# Patient Record
Sex: Female | Born: 1940 | Race: Black or African American | Hispanic: No | Marital: Married | Smoking: Current some day smoker
Health system: Southern US, Community
[De-identification: ages and names within clinical notes are randomized; demographics above are authoritative.]

## PROBLEM LIST (undated history)

## (undated) DIAGNOSIS — I10 Essential (primary) hypertension: Secondary | ICD-10-CM

## (undated) DIAGNOSIS — F419 Anxiety disorder, unspecified: Secondary | ICD-10-CM

## (undated) DIAGNOSIS — M1990 Unspecified osteoarthritis, unspecified site: Secondary | ICD-10-CM

## (undated) HISTORY — PX: ABDOMINAL HYSTERECTOMY: SHX81

## (undated) HISTORY — PX: BUNIONECTOMY: SHX129

---

## 1997-10-18 ENCOUNTER — Emergency Department (HOSPITAL_COMMUNITY): Admission: EM | Admit: 1997-10-18 | Discharge: 1997-10-18 | Payer: Self-pay | Admitting: Emergency Medicine

## 1998-03-09 ENCOUNTER — Emergency Department (HOSPITAL_COMMUNITY): Admission: EM | Admit: 1998-03-09 | Discharge: 1998-03-09 | Payer: Self-pay | Admitting: Emergency Medicine

## 1998-08-18 ENCOUNTER — Emergency Department (HOSPITAL_COMMUNITY): Admission: EM | Admit: 1998-08-18 | Discharge: 1998-08-18 | Payer: Self-pay | Admitting: *Deleted

## 1998-09-16 ENCOUNTER — Emergency Department (HOSPITAL_COMMUNITY): Admission: EM | Admit: 1998-09-16 | Discharge: 1998-09-16 | Payer: Self-pay | Admitting: *Deleted

## 1998-09-27 ENCOUNTER — Emergency Department (HOSPITAL_COMMUNITY): Admission: EM | Admit: 1998-09-27 | Discharge: 1998-09-27 | Payer: Self-pay | Admitting: *Deleted

## 1999-03-21 ENCOUNTER — Emergency Department (HOSPITAL_COMMUNITY): Admission: EM | Admit: 1999-03-21 | Discharge: 1999-03-21 | Payer: Self-pay

## 1999-08-06 ENCOUNTER — Emergency Department (HOSPITAL_COMMUNITY): Admission: EM | Admit: 1999-08-06 | Discharge: 1999-08-06 | Payer: Self-pay | Admitting: Emergency Medicine

## 1999-12-02 ENCOUNTER — Emergency Department (HOSPITAL_COMMUNITY): Admission: EM | Admit: 1999-12-02 | Discharge: 1999-12-02 | Payer: Self-pay | Admitting: Internal Medicine

## 2000-02-13 ENCOUNTER — Emergency Department (HOSPITAL_COMMUNITY): Admission: EM | Admit: 2000-02-13 | Discharge: 2000-02-13 | Payer: Self-pay | Admitting: Emergency Medicine

## 2003-11-26 ENCOUNTER — Emergency Department (HOSPITAL_COMMUNITY): Admission: EM | Admit: 2003-11-26 | Discharge: 2003-11-26 | Payer: Self-pay | Admitting: Emergency Medicine

## 2005-04-08 ENCOUNTER — Ambulatory Visit (HOSPITAL_COMMUNITY): Admission: RE | Admit: 2005-04-08 | Discharge: 2005-04-08 | Payer: Self-pay

## 2005-05-27 ENCOUNTER — Other Ambulatory Visit: Admission: RE | Admit: 2005-05-27 | Discharge: 2005-05-27 | Payer: Self-pay | Admitting: Family Medicine

## 2006-07-20 ENCOUNTER — Encounter: Admission: RE | Admit: 2006-07-20 | Discharge: 2006-07-20 | Payer: Self-pay | Admitting: Family Medicine

## 2007-07-17 ENCOUNTER — Emergency Department (HOSPITAL_COMMUNITY): Admission: EM | Admit: 2007-07-17 | Discharge: 2007-07-17 | Payer: Self-pay | Admitting: Emergency Medicine

## 2008-08-31 ENCOUNTER — Encounter: Admission: RE | Admit: 2008-08-31 | Discharge: 2008-08-31 | Payer: Self-pay | Admitting: Family Medicine

## 2008-10-02 ENCOUNTER — Emergency Department (HOSPITAL_COMMUNITY): Admission: EM | Admit: 2008-10-02 | Discharge: 2008-10-02 | Payer: Self-pay | Admitting: Emergency Medicine

## 2008-10-16 ENCOUNTER — Encounter: Admission: RE | Admit: 2008-10-16 | Discharge: 2008-10-16 | Payer: Self-pay | Admitting: Family Medicine

## 2009-11-11 ENCOUNTER — Emergency Department (HOSPITAL_BASED_OUTPATIENT_CLINIC_OR_DEPARTMENT_OTHER): Admission: EM | Admit: 2009-11-11 | Discharge: 2009-11-11 | Payer: Self-pay | Admitting: Emergency Medicine

## 2009-11-11 ENCOUNTER — Ambulatory Visit: Payer: Self-pay | Admitting: Diagnostic Radiology

## 2010-02-17 ENCOUNTER — Encounter: Payer: Self-pay | Admitting: Family Medicine

## 2010-04-10 LAB — URINALYSIS, ROUTINE W REFLEX MICROSCOPIC
Bilirubin Urine: NEGATIVE
Glucose, UA: NEGATIVE mg/dL
Ketones, ur: NEGATIVE mg/dL
pH: 6.5 (ref 5.0–8.0)

## 2010-04-10 LAB — DIFFERENTIAL
Basophils Relative: 1 % (ref 0–1)
Eosinophils Absolute: 0 10*3/uL (ref 0.0–0.7)
Lymphs Abs: 1.1 10*3/uL (ref 0.7–4.0)

## 2010-04-10 LAB — CBC
HCT: 39.6 % (ref 36.0–46.0)
MCHC: 34.2 g/dL (ref 30.0–36.0)
MCV: 88.5 fL (ref 78.0–100.0)
RDW: 13 % (ref 11.5–15.5)
WBC: 6.3 10*3/uL (ref 4.0–10.5)

## 2010-04-10 LAB — URINE MICROSCOPIC-ADD ON

## 2010-04-10 LAB — BASIC METABOLIC PANEL
BUN: 15 mg/dL (ref 6–23)
Chloride: 102 mEq/L (ref 96–112)
Glucose, Bld: 83 mg/dL (ref 70–99)
Potassium: 3.6 mEq/L (ref 3.5–5.1)

## 2010-04-10 LAB — URINE CULTURE

## 2010-05-03 LAB — POCT I-STAT, CHEM 8
Chloride: 89 mEq/L — ABNORMAL LOW (ref 96–112)
HCT: 41 % (ref 36.0–46.0)
Hemoglobin: 13.9 g/dL (ref 12.0–15.0)
Potassium: 3.1 mEq/L — ABNORMAL LOW (ref 3.5–5.1)
Sodium: 130 mEq/L — ABNORMAL LOW (ref 135–145)

## 2010-05-30 ENCOUNTER — Inpatient Hospital Stay (INDEPENDENT_AMBULATORY_CARE_PROVIDER_SITE_OTHER)
Admission: RE | Admit: 2010-05-30 | Discharge: 2010-05-30 | Disposition: A | Discharge status: Home / Self Care | Payer: Medicare Other | Source: Ambulatory Visit | Attending: Emergency Medicine | Admitting: Emergency Medicine

## 2010-05-30 ENCOUNTER — Observation Stay (HOSPITAL_COMMUNITY)
Admission: EM | Admit: 2010-05-30 | Discharge: 2010-05-30 | Disposition: A | Discharge status: Home / Self Care | Payer: Medicare Other | Source: Ambulatory Visit | Attending: Emergency Medicine | Admitting: Emergency Medicine

## 2010-05-30 DIAGNOSIS — N289 Disorder of kidney and ureter, unspecified: Secondary | ICD-10-CM | POA: Insufficient documentation

## 2010-05-30 DIAGNOSIS — I10 Essential (primary) hypertension: Secondary | ICD-10-CM

## 2010-05-30 DIAGNOSIS — R630 Anorexia: Secondary | ICD-10-CM

## 2010-05-30 DIAGNOSIS — N19 Unspecified kidney failure: Secondary | ICD-10-CM

## 2010-05-30 DIAGNOSIS — E860 Dehydration: Principal | ICD-10-CM | POA: Insufficient documentation

## 2010-05-30 LAB — DIFFERENTIAL
Basophils Relative: 0 % (ref 0–1)
Lymphocytes Relative: 13 % (ref 12–46)
Lymphs Abs: 1.3 10*3/uL (ref 0.7–4.0)
Monocytes Relative: 9 % (ref 3–12)
Neutro Abs: 7.7 10*3/uL (ref 1.7–7.7)
Neutrophils Relative %: 79 % — ABNORMAL HIGH (ref 43–77)

## 2010-05-30 LAB — URINALYSIS, ROUTINE W REFLEX MICROSCOPIC
Ketones, ur: 15 mg/dL — AB
Nitrite: NEGATIVE
Specific Gravity, Urine: 1.02 (ref 1.005–1.030)
Urobilinogen, UA: 1 mg/dL (ref 0.0–1.0)

## 2010-05-30 LAB — POCT I-STAT, CHEM 8
BUN: 34 mg/dL — ABNORMAL HIGH (ref 6–23)
Chloride: 95 mEq/L — ABNORMAL LOW (ref 96–112)
Potassium: 3.3 mEq/L — ABNORMAL LOW (ref 3.5–5.1)
Sodium: 134 mEq/L — ABNORMAL LOW (ref 135–145)

## 2010-05-30 LAB — COMPREHENSIVE METABOLIC PANEL
AST: 16 U/L (ref 0–37)
CO2: 35 mEq/L — ABNORMAL HIGH (ref 19–32)
Chloride: 90 mEq/L — ABNORMAL LOW (ref 96–112)
Creatinine, Ser: 2.03 mg/dL — ABNORMAL HIGH (ref 0.4–1.2)
GFR calc Af Amer: 30 mL/min — ABNORMAL LOW (ref >60)
GFR calc non Af Amer: 24 mL/min — ABNORMAL LOW (ref >60)
Total Bilirubin: 0.6 mg/dL (ref 0.3–1.2)

## 2010-05-30 LAB — CBC
HCT: 44.5 % (ref 36.0–46.0)
Hemoglobin: 15.7 g/dL — ABNORMAL HIGH (ref 12.0–15.0)
MCH: 30 pg (ref 26.0–34.0)
MCV: 84.9 fL (ref 78.0–100.0)
RBC: 5.24 MIL/uL — ABNORMAL HIGH (ref 3.87–5.11)

## 2010-05-30 LAB — POCT URINALYSIS DIP (DEVICE): Specific Gravity, Urine: >=1.03 (ref 1.005–1.030)

## 2010-05-30 LAB — URINE MICROSCOPIC-ADD ON

## 2010-05-31 LAB — URINE CULTURE
Colony Count: NO GROWTH
Culture  Setup Time: 201205031817

## 2011-10-02 ENCOUNTER — Other Ambulatory Visit: Payer: Self-pay | Admitting: Internal Medicine

## 2011-10-02 DIAGNOSIS — M25512 Pain in left shoulder: Secondary | ICD-10-CM

## 2011-10-08 ENCOUNTER — Ambulatory Visit
Admission: RE | Admit: 2011-10-08 | Discharge: 2011-10-08 | Disposition: A | Discharge status: Home / Self Care | Payer: Medicare Other | Source: Ambulatory Visit | Attending: Internal Medicine | Admitting: Internal Medicine

## 2011-10-08 DIAGNOSIS — M25512 Pain in left shoulder: Secondary | ICD-10-CM

## 2011-10-09 ENCOUNTER — Other Ambulatory Visit: Payer: Self-pay | Admitting: Internal Medicine

## 2011-10-09 DIAGNOSIS — M25512 Pain in left shoulder: Secondary | ICD-10-CM

## 2011-10-10 ENCOUNTER — Other Ambulatory Visit: Payer: Self-pay | Admitting: Internal Medicine

## 2011-10-10 DIAGNOSIS — M25511 Pain in right shoulder: Secondary | ICD-10-CM

## 2011-11-06 ENCOUNTER — Other Ambulatory Visit: Payer: Self-pay | Admitting: Physician Assistant

## 2011-11-07 ENCOUNTER — Encounter (HOSPITAL_COMMUNITY): Payer: Self-pay | Admitting: Pharmacy Technician

## 2011-11-11 ENCOUNTER — Ambulatory Visit (HOSPITAL_COMMUNITY)
Admission: RE | Admit: 2011-11-11 | Discharge: 2011-11-11 | Disposition: A | Discharge status: Home / Self Care | Payer: Medicare Other | Source: Ambulatory Visit | Attending: Internal Medicine | Admitting: Internal Medicine

## 2011-11-11 ENCOUNTER — Encounter (HOSPITAL_COMMUNITY): Payer: Self-pay

## 2011-11-11 ENCOUNTER — Encounter (HOSPITAL_COMMUNITY)
Admission: RE | Admit: 2011-11-11 | Discharge: 2011-11-11 | Disposition: A | Discharge status: Home / Self Care | Payer: Medicare Other | Source: Ambulatory Visit | Attending: Internal Medicine | Admitting: Internal Medicine

## 2011-11-11 DIAGNOSIS — Z01818 Encounter for other preprocedural examination: Secondary | ICD-10-CM | POA: Insufficient documentation

## 2011-11-11 DIAGNOSIS — I700 Atherosclerosis of aorta: Secondary | ICD-10-CM | POA: Insufficient documentation

## 2011-11-11 DIAGNOSIS — Z01810 Encounter for preprocedural cardiovascular examination: Secondary | ICD-10-CM | POA: Insufficient documentation

## 2011-11-11 DIAGNOSIS — Z01812 Encounter for preprocedural laboratory examination: Secondary | ICD-10-CM | POA: Insufficient documentation

## 2011-11-11 HISTORY — DX: Anxiety disorder, unspecified: F41.9

## 2011-11-11 HISTORY — DX: Essential (primary) hypertension: I10

## 2011-11-11 HISTORY — DX: Unspecified osteoarthritis, unspecified site: M19.90

## 2011-11-11 LAB — BASIC METABOLIC PANEL
BUN: 16 mg/dL (ref 6–23)
Chloride: 97 mEq/L (ref 96–112)
GFR calc Af Amer: 78 mL/min — ABNORMAL LOW (ref >90)
GFR calc non Af Amer: 67 mL/min — ABNORMAL LOW (ref >90)
Potassium: 3.4 mEq/L — ABNORMAL LOW (ref 3.5–5.1)
Sodium: 137 mEq/L (ref 135–145)

## 2011-11-11 LAB — CBC
Hemoglobin: 13.2 g/dL (ref 12.0–15.0)
MCHC: 33.6 g/dL (ref 30.0–36.0)
RBC: 4.6 MIL/uL (ref 3.87–5.11)
WBC: 7.2 10*3/uL (ref 4.0–10.5)

## 2011-11-11 LAB — TYPE AND SCREEN
ABO/RH(D): O POS
Antibody Screen: NEGATIVE

## 2011-11-11 LAB — SURGICAL PCR SCREEN
MRSA, PCR: NEGATIVE
Staphylococcus aureus: POSITIVE — AB

## 2011-11-11 NOTE — Progress Notes (Signed)
Pt. Reports that she is not taking Wellbutrin or Xanax or Vitamin D.  She reports that MD told her she doesn't need them, yet the last pcp note reports to continue taking.

## 2011-11-11 NOTE — Pre-Procedure Instructions (Signed)
Denise Nelson  11/11/2011   Your procedure is scheduled on:  11/14/2011  Report to [facility] at 8:00 AM.  Call this number if you have problems the morning of surgery: [phone]   Remember:   Do not eat food:After Midnight.    Take these medicines the morning of surgery with A SIP OF WATER: prednisone   Do not wear jewelry, make-up or nail polish.  Do not wear lotions, powders, or perfumes. You may wear deodorant.  Do not shave 48 hours prior to surgery. Men may shave face and neck.  Do not bring valuables to the hospital.  Contacts, dentures or bridgework may not be worn into surgery.  Leave suitcase in the car. After surgery it may be brought to your room.  For patients admitted to the hospital, checkout time is 11:00 AM the day of discharge.   Patients discharged the day of surgery will not be allowed to drive home.  Name and phone number of your driver: /w SON  Special Instructions: Shower using CHG 2 nights before surgery and the night before surgery.  If you shower the day of surgery use CHG.  Use special wash - you have one bottle of CHG for all showers.  You should use approximately 1/3 of the bottle for each shower.   Please read over the following fact sheets that you were given: Pain Booklet, Coughing and Deep Breathing, Blood Transfusion Information, MRSA Information and Surgical Site Infection Prevention

## 2011-11-12 NOTE — Consult Note (Signed)
Anesthesia chart review: Patient is a 71 year old female scheduled for right reverse total shoulder arthroplasty by Dr. Devonne Doughty on 11/14/2011. History includes smoking, hypertension, arthritis (systemic sclerosis) on chronic steroids, anxiety.  PCP is Dr. Maurice Small who cleared her for this procedure but recommended getting input from her rheumatologist.  Her last rheumatology visit was from Dr. Hart Rochester on 10/30/2011. Her rheumatology clearance note is from his partner Dr. Dareen Piano who recommended continuing her current prednisone dose.  CXR on 1015/13 showed: 1. No radiographic evidence of acute cardiopulmonary disease.  2. Atherosclerosis of the thoracic aorta.  3. Dextroscoliosis of the lumbar spine.   EKG on 11/11/11 showed SR with PACs, minimal voltage criteria for LVH, non-specific T wave abnormality.  Labs noted.  Anticipate she can proceed as planned.  Shonna Chock, PA-C

## 2011-11-13 MED ORDER — CEFAZOLIN SODIUM-DEXTROSE 2-3 GM-% IV SOLR
2.0000 g | INTRAVENOUS | Status: AC
  Administered 2011-11-14: 2 g via INTRAVENOUS
  Filled 2011-11-13: qty 50

## 2011-11-13 NOTE — H&P (Signed)
CC: right shoulder pain and weakness HPI: 71 y/o female with worsening right shoulder pain and weakness due to rotator cuff arthropathy. Pt has elected for a reverse total shoulder to decrease pain and increase function PMH: rheumatoid arthritis Social: lives alone, no etoh, current smoker Family: rheumatoid Allergies: NKDA Meds: prednisone, norco, tramadol ROS: pain and weakness right shoulder PE: alert and appropriate 71 y/o female in no acute distress Cervical spine: full rom no tenderness, cranial nerves 2-12 intact Chest active breath sounds bilaterally Right shoulder: moderate decreased rom, crepitus with rom  nv intact distally X-rays: rotator cuff arthropathy right shoulder  Assessment; right shoulder pain secondary to rotator cuff arthropathy Plan: right shoulder reverse total shoulder replacement

## 2011-11-13 NOTE — Progress Notes (Signed)
SPOKE WITH PATIENT .Marland Kitchen SHE DOES NOT HAVE A CARDIOLOGIST AND HAS NO MEMORY OF PFTS OR ECHO.  STATES SHE SEES DR. Dierdre Forth AT [facility] AND BELIEVES HE MIGHT HAVE THESE RESULTS(TESTS) IF THEY WERE DONE.   FAXED REQUEST FOR  PFTS AND ECHO TO  [phone]

## 2011-11-14 ENCOUNTER — Encounter (HOSPITAL_COMMUNITY): Payer: Self-pay | Admitting: Vascular Surgery

## 2011-11-14 ENCOUNTER — Inpatient Hospital Stay (HOSPITAL_COMMUNITY): Payer: Medicare Other | Admitting: Vascular Surgery

## 2011-11-14 ENCOUNTER — Inpatient Hospital Stay (HOSPITAL_COMMUNITY)
Admission: RE | Admit: 2011-11-14 | Discharge: 2011-11-15 | DRG: 484 | Disposition: A | Discharge status: Home / Self Care | Payer: Medicare Other | Source: Ambulatory Visit | Attending: Orthopedic Surgery | Admitting: Orthopedic Surgery

## 2011-11-14 ENCOUNTER — Inpatient Hospital Stay (HOSPITAL_COMMUNITY): Payer: Medicare Other

## 2011-11-14 ENCOUNTER — Encounter (HOSPITAL_COMMUNITY)
Admission: RE | Disposition: A | Discharge status: Home / Self Care | Payer: Self-pay | Source: Ambulatory Visit | Attending: Orthopedic Surgery

## 2011-11-14 ENCOUNTER — Encounter (HOSPITAL_COMMUNITY): Payer: Self-pay | Admitting: Surgery

## 2011-11-14 DIAGNOSIS — M67919 Unspecified disorder of synovium and tendon, unspecified shoulder: Principal | ICD-10-CM | POA: Diagnosis present

## 2011-11-14 DIAGNOSIS — F411 Generalized anxiety disorder: Secondary | ICD-10-CM | POA: Diagnosis present

## 2011-11-14 DIAGNOSIS — Z79899 Other long term (current) drug therapy: Secondary | ICD-10-CM

## 2011-11-14 DIAGNOSIS — J449 Chronic obstructive pulmonary disease, unspecified: Secondary | ICD-10-CM | POA: Diagnosis present

## 2011-11-14 DIAGNOSIS — I10 Essential (primary) hypertension: Secondary | ICD-10-CM | POA: Diagnosis present

## 2011-11-14 DIAGNOSIS — M069 Rheumatoid arthritis, unspecified: Secondary | ICD-10-CM | POA: Diagnosis present

## 2011-11-14 DIAGNOSIS — F17200 Nicotine dependence, unspecified, uncomplicated: Secondary | ICD-10-CM | POA: Diagnosis present

## 2011-11-14 DIAGNOSIS — J4489 Other specified chronic obstructive pulmonary disease: Secondary | ICD-10-CM | POA: Diagnosis present

## 2011-11-14 DIAGNOSIS — M719 Bursopathy, unspecified: Principal | ICD-10-CM | POA: Diagnosis present

## 2011-11-14 HISTORY — PX: REVERSE SHOULDER ARTHROPLASTY: SHX5054

## 2011-11-14 LAB — GLUCOSE, CAPILLARY: Glucose-Capillary: 130 mg/dL — ABNORMAL HIGH (ref 70–99)

## 2011-11-14 SURGERY — ARTHROPLASTY, SHOULDER, TOTAL, REVERSE
Anesthesia: General | Site: Shoulder | Laterality: Right | Wound class: Clean

## 2011-11-14 MED ORDER — METHOCARBAMOL 100 MG/ML IJ SOLN
500.0000 mg | Freq: Four times a day (QID) | INTRAVENOUS | Status: DC | PRN
  Filled 2011-11-14: qty 5

## 2011-11-14 MED ORDER — HYDROCHLOROTHIAZIDE 25 MG PO TABS
25.0000 mg | ORAL_TABLET | Freq: Every day | ORAL | Status: DC
  Administered 2011-11-14: 25 mg via ORAL
  Filled 2011-11-14 (×3): qty 1

## 2011-11-14 MED ORDER — METHOCARBAMOL 500 MG PO TABS: 500.0000 mg | ORAL_TABLET | Freq: Four times a day (QID) | ORAL | Status: DC | PRN

## 2011-11-14 MED ORDER — PROMETHAZINE HCL 25 MG/ML IJ SOLN: 6.2500 mg | INTRAMUSCULAR | Status: DC | PRN

## 2011-11-14 MED ORDER — OXYCODONE HCL 5 MG/5ML PO SOLN: 5.0000 mg | Freq: Once | ORAL | Status: DC | PRN

## 2011-11-14 MED ORDER — METHOCARBAMOL 500 MG PO TABS: 500.0000 mg | ORAL_TABLET | Freq: Three times a day (TID) | ORAL | Status: DC | PRN

## 2011-11-14 MED ORDER — ACETAMINOPHEN 650 MG RE SUPP: 650.0000 mg | Freq: Four times a day (QID) | RECTAL | Status: DC | PRN

## 2011-11-14 MED ORDER — ACETAMINOPHEN 10 MG/ML IV SOLN: 1000.0000 mg | Freq: Once | INTRAVENOUS | Status: DC

## 2011-11-14 MED ORDER — PROPOFOL 10 MG/ML IV BOLUS
INTRAVENOUS | Status: DC | PRN
  Administered 2011-11-14: 120 mg via INTRAVENOUS

## 2011-11-14 MED ORDER — MIDAZOLAM HCL 5 MG/ML IJ SOLN
2.0000 mg | Freq: Once | INTRAMUSCULAR | Status: AC
  Administered 2011-11-14: 2 mg via INTRAVENOUS

## 2011-11-14 MED ORDER — MENTHOL 3 MG MT LOZG: 1.0000 | LOZENGE | OROMUCOSAL | Status: DC | PRN

## 2011-11-14 MED ORDER — BENAZEPRIL HCL 20 MG PO TABS
20.0000 mg | ORAL_TABLET | Freq: Every day | ORAL | Status: DC
  Administered 2011-11-14: 20 mg via ORAL
  Filled 2011-11-14 (×3): qty 1

## 2011-11-14 MED ORDER — MIDAZOLAM HCL 2 MG/2ML IJ SOLN: 1.0000 mg | INTRAMUSCULAR | Status: DC | PRN

## 2011-11-14 MED ORDER — PHENOL 1.4 % MT LIQD: 1.0000 | OROMUCOSAL | Status: DC | PRN

## 2011-11-14 MED ORDER — OXYCODONE HCL 5 MG PO TABS: 5.0000 mg | ORAL_TABLET | Freq: Once | ORAL | Status: DC | PRN

## 2011-11-14 MED ORDER — OXYCODONE-ACETAMINOPHEN 5-325 MG PO TABS
1.0000 | ORAL_TABLET | ORAL | Status: DC | PRN
  Administered 2011-11-15 (×2): 2 via ORAL
  Filled 2011-11-14 (×2): qty 2

## 2011-11-14 MED ORDER — LIDOCAINE HCL (CARDIAC) 20 MG/ML IV SOLN
INTRAVENOUS | Status: DC | PRN
  Administered 2011-11-14: 80 mg via INTRAVENOUS

## 2011-11-14 MED ORDER — BUPIVACAINE-EPINEPHRINE PF 0.25-1:200000 % IJ SOLN
INTRAMUSCULAR | Status: AC
  Filled 2011-11-14: qty 30

## 2011-11-14 MED ORDER — PHENYLEPHRINE HCL 10 MG/ML IJ SOLN
10.0000 mg | INTRAVENOUS | Status: DC | PRN
  Administered 2011-11-14: 20 ug/min via INTRAVENOUS

## 2011-11-14 MED ORDER — DEXAMETHASONE SODIUM PHOSPHATE 4 MG/ML IJ SOLN
INTRAMUSCULAR | Status: DC | PRN
  Administered 2011-11-14: 4 mg

## 2011-11-14 MED ORDER — HYDROMORPHONE HCL PF 1 MG/ML IJ SOLN: 0.2500 mg | INTRAMUSCULAR | Status: DC | PRN

## 2011-11-14 MED ORDER — SODIUM CHLORIDE 0.9 % IV SOLN
INTRAVENOUS | Status: DC
  Administered 2011-11-14: 16:00:00 via INTRAVENOUS

## 2011-11-14 MED ORDER — FENTANYL CITRATE 0.05 MG/ML IJ SOLN
INTRAMUSCULAR | Status: AC
  Filled 2011-11-14: qty 2

## 2011-11-14 MED ORDER — METOCLOPRAMIDE HCL 10 MG PO TABS: 5.0000 mg | ORAL_TABLET | Freq: Three times a day (TID) | ORAL | Status: DC | PRN

## 2011-11-14 MED ORDER — MORPHINE SULFATE 2 MG/ML IJ SOLN: 1.0000 mg | INTRAMUSCULAR | Status: DC | PRN

## 2011-11-14 MED ORDER — PHENYLEPHRINE HCL 10 MG/ML IJ SOLN
INTRAMUSCULAR | Status: DC | PRN
  Administered 2011-11-14 (×2): 80 ug via INTRAVENOUS
  Administered 2011-11-14: 40 ug via INTRAVENOUS
  Administered 2011-11-14: 80 ug via INTRAVENOUS

## 2011-11-14 MED ORDER — BENAZEPRIL-HYDROCHLOROTHIAZIDE 20-25 MG PO TABS: 1.0000 | ORAL_TABLET | Freq: Every day | ORAL | Status: DC

## 2011-11-14 MED ORDER — FENTANYL CITRATE 0.05 MG/ML IJ SOLN: 50.0000 ug | Freq: Once | INTRAMUSCULAR | Status: DC

## 2011-11-14 MED ORDER — NEOSTIGMINE METHYLSULFATE 1 MG/ML IJ SOLN
INTRAMUSCULAR | Status: DC | PRN
  Administered 2011-11-14: 4 mg via INTRAVENOUS

## 2011-11-14 MED ORDER — ONDANSETRON HCL 4 MG PO TABS: 4.0000 mg | ORAL_TABLET | Freq: Four times a day (QID) | ORAL | Status: DC | PRN

## 2011-11-14 MED ORDER — ONDANSETRON HCL 4 MG/2ML IJ SOLN: 4.0000 mg | Freq: Four times a day (QID) | INTRAMUSCULAR | Status: DC | PRN

## 2011-11-14 MED ORDER — CEFAZOLIN SODIUM 1-5 GM-% IV SOLN
1.0000 g | Freq: Four times a day (QID) | INTRAVENOUS | Status: AC
  Administered 2011-11-14 – 2011-11-15 (×3): 1 g via INTRAVENOUS
  Filled 2011-11-14 (×3): qty 50

## 2011-11-14 MED ORDER — ACETAMINOPHEN 325 MG PO TABS: 650.0000 mg | ORAL_TABLET | Freq: Four times a day (QID) | ORAL | Status: DC | PRN

## 2011-11-14 MED ORDER — OXYCODONE-ACETAMINOPHEN 5-325 MG PO TABS: 1.0000 | ORAL_TABLET | ORAL | Status: AC | PRN

## 2011-11-14 MED ORDER — FENTANYL CITRATE 0.05 MG/ML IJ SOLN
50.0000 ug | Freq: Once | INTRAMUSCULAR | Status: AC
  Administered 2011-11-14: 50 ug via INTRAVENOUS

## 2011-11-14 MED ORDER — ONDANSETRON HCL 4 MG/2ML IJ SOLN
INTRAMUSCULAR | Status: DC | PRN
  Administered 2011-11-14: 4 mg via INTRAVENOUS

## 2011-11-14 MED ORDER — LACTATED RINGERS IV SOLN
INTRAVENOUS | Status: DC
  Administered 2011-11-14: 09:00:00 via INTRAVENOUS

## 2011-11-14 MED ORDER — TRAMADOL HCL 50 MG PO TABS: 50.0000 mg | ORAL_TABLET | Freq: Four times a day (QID) | ORAL | Status: DC | PRN

## 2011-11-14 MED ORDER — MIDAZOLAM HCL 2 MG/2ML IJ SOLN
INTRAMUSCULAR | Status: AC
  Filled 2011-11-14: qty 2

## 2011-11-14 MED ORDER — METOCLOPRAMIDE HCL 5 MG/ML IJ SOLN: 5.0000 mg | Freq: Three times a day (TID) | INTRAMUSCULAR | Status: DC | PRN

## 2011-11-14 MED ORDER — BUPIVACAINE-EPINEPHRINE 0.25% -1:200000 IJ SOLN
INTRAMUSCULAR | Status: DC | PRN
  Administered 2011-11-14: 20 mL

## 2011-11-14 MED ORDER — PREDNISONE 5 MG PO TABS
7.5000 mg | ORAL_TABLET | Freq: Every day | ORAL | Status: DC
  Administered 2011-11-14: 5 mg via ORAL
  Administered 2011-11-15: 7.5 mg via ORAL
  Filled 2011-11-14 (×3): qty 1

## 2011-11-14 MED ORDER — LACTATED RINGERS IV SOLN
INTRAVENOUS | Status: DC | PRN
  Administered 2011-11-14 (×2): via INTRAVENOUS

## 2011-11-14 MED ORDER — SODIUM CHLORIDE 0.9 % IR SOLN
Status: DC | PRN
  Administered 2011-11-14: 1000 mL

## 2011-11-14 MED ORDER — BUPIVACAINE-EPINEPHRINE PF 0.5-1:200000 % IJ SOLN
INTRAMUSCULAR | Status: DC | PRN
  Administered 2011-11-14: 20 mL

## 2011-11-14 MED ORDER — GLYCOPYRROLATE 0.2 MG/ML IJ SOLN
INTRAMUSCULAR | Status: DC | PRN
  Administered 2011-11-14: .6 mg via INTRAVENOUS

## 2011-11-14 MED ORDER — ROCURONIUM BROMIDE 100 MG/10ML IV SOLN
INTRAVENOUS | Status: DC | PRN
  Administered 2011-11-14: 40 mg via INTRAVENOUS

## 2011-11-14 SURGICAL SUPPLY — 61 items
BLADE SAG 18X100X1.27 (BLADE) ×2 IMPLANT
BOWL SMART MIX CTS (DISPOSABLE) IMPLANT
BUR SURG 4X8 MED (BURR) IMPLANT
BURR SURG 4X8 MED (BURR)
CLOTH BEACON ORANGE TIMEOUT ST (SAFETY) ×2 IMPLANT
COVER SURGICAL LIGHT HANDLE (MISCELLANEOUS) ×2 IMPLANT
DRAPE INCISE IOBAN 66X45 STRL (DRAPES) ×2 IMPLANT
DRAPE U-SHAPE 47X51 STRL (DRAPES) ×2 IMPLANT
DRAPE X-RAY CASS 24X20 (DRAPES) IMPLANT
DRSG ADAPTIC 3X8 NADH LF (GAUZE/BANDAGES/DRESSINGS) ×2 IMPLANT
DRSG PAD ABDOMINAL 8X10 ST (GAUZE/BANDAGES/DRESSINGS) ×3 IMPLANT
DURAPREP 26ML APPLICATOR (WOUND CARE) ×2 IMPLANT
ELECT BLADE 4.0 EZ CLEAN MEGAD (MISCELLANEOUS) ×2
ELECT BLADE 6.5 EXT (BLADE) IMPLANT
ELECT NDL TIP 2.8 STRL (NEEDLE) ×1 IMPLANT
ELECT NEEDLE TIP 2.8 STRL (NEEDLE) ×2 IMPLANT
ELECT REM PT RETURN 9FT ADLT (ELECTROSURGICAL) ×2
ELECTRODE BLDE 4.0 EZ CLN MEGD (MISCELLANEOUS) ×1 IMPLANT
ELECTRODE REM PT RTRN 9FT ADLT (ELECTROSURGICAL) ×1 IMPLANT
GLOVE BIOGEL PI ORTHO PRO 7.5 (GLOVE) ×4
GLOVE BIOGEL PI ORTHO PRO SZ8 (GLOVE) ×1
GLOVE ORTHO TXT STRL SZ7.5 (GLOVE) ×4 IMPLANT
GLOVE PI ORTHO PRO STRL 7.5 (GLOVE) ×1 IMPLANT
GLOVE PI ORTHO PRO STRL SZ8 (GLOVE) ×1 IMPLANT
GLOVE SURG ORTHO 8.5 STRL (GLOVE) ×2 IMPLANT
GOWN STRL NON-REIN LRG LVL3 (GOWN DISPOSABLE) ×1 IMPLANT
GOWN STRL REIN XL XLG (GOWN DISPOSABLE) ×5 IMPLANT
HANDPIECE INTERPULSE COAX TIP (DISPOSABLE) ×2
KIT BASIN OR (CUSTOM PROCEDURE TRAY) ×2 IMPLANT
KIT ROOM TURNOVER OR (KITS) ×1 IMPLANT
MANIFOLD NEPTUNE II (INSTRUMENTS) ×2 IMPLANT
NDL 1/2 CIR MAYO (NEEDLE) ×1 IMPLANT
NDL HYPO 25GX1X1/2 BEV (NEEDLE) ×1 IMPLANT
NEEDLE 1/2 CIR MAYO (NEEDLE) ×2 IMPLANT
NEEDLE HYPO 25GX1X1/2 BEV (NEEDLE) ×2 IMPLANT
NS IRRIG 1000ML POUR BTL (IV SOLUTION) ×2 IMPLANT
PACK SHOULDER (CUSTOM PROCEDURE TRAY) ×2 IMPLANT
PAD ARMBOARD 7.5X6 YLW CONV (MISCELLANEOUS) ×4 IMPLANT
SET HNDPC FAN SPRY TIP SCT (DISPOSABLE) IMPLANT
SLING ARM FOAM STRAP LRG (SOFTGOODS) IMPLANT
SLING ARM FOAM STRAP MED (SOFTGOODS) IMPLANT
SPONGE GAUZE 4X4 12PLY (GAUZE/BANDAGES/DRESSINGS) ×2 IMPLANT
SPONGE LAP 18X18 X RAY DECT (DISPOSABLE) ×2 IMPLANT
SPONGE LAP 4X18 X RAY DECT (DISPOSABLE) ×2 IMPLANT
STRIP CLOSURE SKIN 1/2X4 (GAUZE/BANDAGES/DRESSINGS) ×2 IMPLANT
SUCTION FRAZIER TIP 10 FR DISP (SUCTIONS) ×2 IMPLANT
SUT FIBERWIRE #2 38 T-5 BLUE (SUTURE) ×4
SUT MNCRL AB 4-0 PS2 18 (SUTURE) ×2 IMPLANT
SUT VIC AB 2-0 CT1 27 (SUTURE) ×2
SUT VIC AB 2-0 CT1 TAPERPNT 27 (SUTURE) ×1 IMPLANT
SUT VICRYL 0 CT 1 36IN (SUTURE) ×3 IMPLANT
SUTURE FIBERWR #2 38 T-5 BLUE (SUTURE) ×1 IMPLANT
SWABSTICK BENZOIN STERILE (MISCELLANEOUS) ×2 IMPLANT
SYR CONTROL 10ML LL (SYRINGE) ×2 IMPLANT
TAPE CLOTH SURG 4X10 WHT LF (GAUZE/BANDAGES/DRESSINGS) ×1 IMPLANT
TOWEL OR 17X24 6PK STRL BLUE (TOWEL DISPOSABLE) ×2 IMPLANT
TOWEL OR 17X26 10 PK STRL BLUE (TOWEL DISPOSABLE) ×2 IMPLANT
TOWER CARTRIDGE SMART MIX (DISPOSABLE) IMPLANT
TRAY FOLEY CATH 14FR (SET/KITS/TRAYS/PACK) ×1 IMPLANT
WATER STERILE IRR 1000ML POUR (IV SOLUTION) ×2 IMPLANT
YANKAUER SUCT BULB TIP NO VENT (SUCTIONS) ×1 IMPLANT

## 2011-11-14 NOTE — Anesthesia Preprocedure Evaluation (Signed)
Anesthesia Evaluation  Patient identified by MRN, date of birth, ID band Patient awake    Reviewed: Allergy & Precautions, H&P , NPO status   Airway Mallampati: I TM Distance: >3 FB Neck ROM: Full    Dental   Pulmonary COPDCurrent Smoker,  + rhonchi         Cardiovascular hypertension, Rhythm:Regular Rate:Normal     Neuro/Psych Anxiety    GI/Hepatic   Endo/Other    Renal/GU      Musculoskeletal   Abdominal   Peds  Hematology   Anesthesia Other Findings   Reproductive/Obstetrics                           Anesthesia Physical Anesthesia Plan  ASA: II  Anesthesia Plan: General   Post-op Pain Management:    Induction: Intravenous  Airway Management Planned: Oral ETT  Additional Equipment:   Intra-op Plan:   Post-operative Plan: Extubation in OR  Informed Consent: I have reviewed the patients History and Physical, chart, labs and discussed the procedure including the risks, benefits and alternatives for the proposed anesthesia with the patient or authorized representative who has indicated his/her understanding and acceptance.     Plan Discussed with: CRNA and Surgeon  Anesthesia Plan Comments:         Anesthesia Quick Evaluation

## 2011-11-14 NOTE — Interval H&P Note (Signed)
History and Physical Interval Note:  11/14/2011 9:17 AM  Denise Nelson  has presented today for surgery, with the diagnosis of RIGHT SHOULDER ROTATOR CUFF ARTHROPATHY  The various methods of treatment have been discussed with the patient and family. After consideration of risks, benefits and other options for treatment, the patient has consented to  Procedure(s) (LRB) with comments: REVERSE SHOULDER ARTHROPLASTY (Right) - GENERAL WITH BLOCK as a surgical intervention .  The patient's history has been reviewed, patient examined, no change in status, stable for surgery.  I have reviewed the patient's chart and labs.  Questions were answered to the patient's satisfaction.     Ignacio Lowder,STEVEN R

## 2011-11-14 NOTE — Transfer of Care (Signed)
Immediate Anesthesia Transfer of Care Note  Patient: Denise Nelson  Procedure(s) Performed: Procedure(s) (LRB) with comments: REVERSE SHOULDER ARTHROPLASTY (Right) - GENERAL WITH BLOCK  Patient Location: PACU  Anesthesia Type: General  Level of Consciousness: awake, alert  and oriented  Airway & Oxygen Therapy: Patient Spontanous Breathing and Patient connected to nasal cannula oxygen  Post-op Assessment: Report given to PACU RN, Post -op Vital signs reviewed and stable and Patient moving all extremities X 4  Post vital signs: Reviewed and stable  Complications: No apparent anesthesia complications

## 2011-11-14 NOTE — Anesthesia Procedure Notes (Signed)
Anesthesia Regional Block:  Interscalene brachial plexus block  Pre-Anesthetic Checklist: ,, timeout performed, Correct Patient, Correct Site, Correct Laterality, Correct Procedure, Correct Position, site marked, Risks and benefits discussed,  Surgical consent,  Pre-op evaluation,  At surgeon's request and post-op pain management  Laterality: Right  Prep: chloraprep       Needles:   Needle Type: Echogenic Stimulator Needle     Needle Length: 5cm 5 cm Needle Gauge: 22 and 22 G    Additional Needles:  Procedures: ultrasound guided and nerve stimulator Interscalene brachial plexus block  Nerve Stimulator or Paresthesia:  Response: 0.48 mA,   Additional Responses:   Narrative:  Start time: 11/14/2011 9:10 AM End time: 11/14/2011 9:18 AM Injection made incrementally with aspirations every 5 mL. Anesthesiologist: Dr Gypsy Balsam  Additional Notes: 9147-8295 R ISB POP CHG prep, sterile tech [place], nerve stim used Pix in chart Neg asp Marc .5% w/ epi 20cc+decadron 4mg  infil No compl Dr Gypsy Balsam

## 2011-11-14 NOTE — Anesthesia Postprocedure Evaluation (Signed)
  Anesthesia Post-op Note  Patient: Denise Nelson  Procedure(s) Performed: Procedure(s) (LRB) with comments: REVERSE SHOULDER ARTHROPLASTY (Right) - GENERAL WITH BLOCK  Patient Location: PACU  Anesthesia Type: GA combined with regional for post-op pain  Level of Consciousness: awake and alert   Airway and Oxygen Therapy: Patient Spontanous Breathing  Post-op Pain: none  Post-op Assessment: Post-op Vital signs reviewed, Patient's Cardiovascular Status Stable, Respiratory Function Stable, Patent Airway, No signs of Nausea or vomiting and Pain level controlled  Post-op Vital Signs: stable  Complications: No apparent anesthesia complications

## 2011-11-14 NOTE — Progress Notes (Signed)
UR COMPLETED  

## 2011-11-14 NOTE — Brief Op Note (Signed)
11/14/2011  12:39 PM  PATIENT:  Denise Nelson  71 y.o. female  PRE-OPERATIVE DIAGNOSIS:  RIGHT SHOULDER ROTATOR CUFF ARTHROPATHY  POST-OPERATIVE DIAGNOSIS:  RIGHT SHOULDER ROTATOR CUFF ARTHROPATHY  PROCEDURE:  Procedure(s) (LRB) with comments: REVERSE SHOULDER ARTHROPLASTY (Right) - GENERAL WITH BLOCK De[employer] Delta Xtend SURGEON:  Surgeon(s) and Role:    * Verlee Rossetti, MD - Primary  PHYSICIAN ASSISTANT:   ASSISTANTS: Thea Gist, PA-C   ANESTHESIA:   regional and general  EBL:  Total I/O In: 1000 [I.V.:1000] Out: 150 [Blood:150]  BLOOD ADMINISTERED:none  DRAINS: none   LOCAL MEDICATIONS USED:  MARCAINE     SPECIMEN:  No Specimen  DISPOSITION OF SPECIMEN:  N/A  COUNTS:  YES  TOURNIQUET:  * No tourniquets in log *  DICTATION: .Other Dictation: Dictation Number [id number]  PLAN OF CARE: Admit to inpatient   PATIENT DISPOSITION:  PACU - hemodynamically stable.   Delay start of Pharmacological VTE agent (>24hrs) due to surgical blood loss or risk of bleeding: not applicable

## 2011-11-15 LAB — HEMOGLOBIN AND HEMATOCRIT, BLOOD
HCT: 32.6 % — ABNORMAL LOW (ref 36.0–46.0)
Hemoglobin: 11 g/dL — ABNORMAL LOW (ref 12.0–15.0)

## 2011-11-15 LAB — BASIC METABOLIC PANEL
CO2: 31 mEq/L (ref 19–32)
Calcium: 9.1 mg/dL (ref 8.4–10.5)
Creatinine, Ser: 0.87 mg/dL (ref 0.50–1.10)
GFR calc Af Amer: 77 mL/min — ABNORMAL LOW (ref >90)
GFR calc non Af Amer: 66 mL/min — ABNORMAL LOW (ref >90)
Sodium: 141 mEq/L (ref 135–145)

## 2011-11-15 NOTE — Progress Notes (Signed)
Pt discharged home today. All paperwork and discharge instructions were discussed and all questions/concerns were addressed. Pt was escorted downstairs via wheelchair by staff and family where her ride is waiting.

## 2011-11-15 NOTE — Discharge Summary (Signed)
PATIENT ID:      Denise Nelson  MRN:     [id number] DOB/AGE:    1940/11/18 / 71 y.o.     DISCHARGE SUMMARY  ADMISSION DATE:    11-28-11 DISCHARGE DATE:    ADMISSION DIAGNOSIS: RIGHT SHOULDER ROTATOR CUFF ARTHROPATHY Past Medical History  Diagnosis Date  . Hypertension   . Anxiety   . Arthritis     systemic sclerosis- per pt. -rheumatoid - bothered  in both ankles     DISCHARGE DIAGNOSIS:   Active Problems:  * No active hospital problems. *    PROCEDURE: Procedure(s): REVERSE SHOULDER ARTHROPLASTY on 11-28-2011  CONSULTS:none     HISTORY:  See H&P in chart.  HOSPITAL COURSE:  KYRSTYN Nelson is a 71 y.o. admitted on 11/28/11 with a chief complaint right shoulder pain, and found to have a diagnosis of RIGHT SHOULDER ROTATOR CUFF ARTHROPATHY.  They were brought to the operating room on 2011/11/28 and underwent Procedure(s): REVERSE SHOULDER ARTHROPLASTY.    They were given perioperative antibiotics: Anti-infectives     Start     Dose/Rate Route Frequency Ordered Stop   November 28, 2011 1630   ceFAZolin (ANCEF) IVPB 1 g/50 mL premix        1 g 100 mL/hr over 30 Minutes Intravenous Every 6 hours November 28, 2011 1449 11/15/11 0509   11/13/11 1449   ceFAZolin (ANCEF) IVPB 2 g/50 mL premix        2 g 100 mL/hr over 30 Minutes Intravenous 60 min pre-op 11/13/11 1449 11/28/11 1036        .  Patient underwent the above named procedure and tolerated it well. The following day they were hemodynamically stable and pain was controlled on oral analgesics. They were neurovascularly intact to the operative extremity. OT was ordered and worked with patient per protocol. They were medically and orthopaedically stable for discharge on 11/15/11 .     DIAGNOSTIC STUDIES:  RECENT LABORATORY STUDIES:   Dg Shoulder 1v Right  2011-11-28  *RADIOLOGY REPORT*  Clinical Data: Status post shoulder arthroplasty  RIGHT SHOULDER - 1 VIEW  Comparison: 10/08/2011 MRI  Findings: Interval right shoulder  arthroplasty.  No periprosthetic lucency or malalignment.  Subcutaneous gas is not unexpected in the recently postoperative state.  IMPRESSION: Interval right shoulder arthroplasty.   Original Report Authenticated By: Waneta Martins, M.D.     RECENT RADIOGRAPHIC STUDIES :  Dg Chest 2 View  11/11/2011  *RADIOLOGY REPORT*  Clinical Data: Preoperative evaluation for shoulder surgery.  CHEST - 2 VIEW  Comparison: Chest x-ray 01/07/2011.  Findings: Lung volumes are normal.  No consolidative airspace disease.  No pleural effusions.  No pneumothorax.  No pulmonary nodule or mass noted.  Pulmonary vasculature and the cardiomediastinal silhouette are within normal limits. Atherosclerosis in the thoracic aorta.  Severe dextroscoliosis of the lumbar spine.  IMPRESSION: 1. No radiographic evidence of acute cardiopulmonary disease. 2.  Atherosclerosis. 3.  Dextroscoliosis of the lumbar spine.   Original Report Authenticated By: Florencia Reasons, M.D.    Dg Shoulder 1v Right  2011/11/28  *RADIOLOGY REPORT*  Clinical Data: Status post shoulder arthroplasty  RIGHT SHOULDER - 1 VIEW  Comparison: 10/08/2011 MRI  Findings: Interval right shoulder arthroplasty.  No periprosthetic lucency or malalignment.  Subcutaneous gas is not unexpected in the recently postoperative state.  IMPRESSION: Interval right shoulder arthroplasty.   Original Report Authenticated By: Waneta Martins, M.D.     RECENT VITAL SIGNS:  Patient Vitals for the past 24 hrs:  BP Temp Pulse Resp SpO2  11/15/11 0634 120/78 mmHg 98.7 F (37.1 C) 65  18  100 %  11/14/11 2000 116/70 mmHg 98.3 F (36.8 C) 67  18  100 %  11/14/11 1422 131/76 mmHg 98 F (36.7 C) 73  20  95 %  11/14/11 1412 - 97 F (36.1 C) 85  21  100 %  11/14/11 1351 146/88 mmHg - 73  19  95 %  11/14/11 1336 147/86 mmHg - 75  22  96 %  11/14/11 1330 - - 69  20  94 %  11/14/11 1321 157/94 mmHg - 68  22  95 %  11/14/11 1308 - - 80  29  88 %  11/14/11 1306 141/73 mmHg - - - -   11/14/11 1300 - 96.8 F (36 C) - - -  .  RECENT EKG RESULTS:    Orders placed during the hospital encounter of 11/14/11  . EKG 12-LEAD  . EKG 12-LEAD  . EKG 12-LEAD  . EKG 12-LEAD    DISCHARGE INSTRUCTIONS:    DISCHARGE MEDICATIONS:     Medication List     As of 11/15/2011 10:23 AM    TAKE these medications         benazepril-hydrochlorthiazide 20-25 MG per tablet   Commonly known as: LOTENSIN HCT   Take 1 tablet by mouth daily.      methocarbamol 500 MG tablet   Commonly known as: ROBAXIN   Take 1 tablet (500 mg total) by mouth 3 (three) times daily as needed.      oxyCODONE-acetaminophen 5-325 MG per tablet   Commonly known as: PERCOCET/ROXICET   Take 1-2 tablets by mouth every 4 (four) hours as needed for pain.      predniSONE 5 MG tablet   Commonly known as: DELTASONE   Take 7.5 mg by mouth daily.      traMADol 50 MG tablet   Commonly known as: ULTRAM   Take 50 mg by mouth every 6 (six) hours as needed. For pain        FOLLOW UP VISIT:       Follow-up Information    Follow up with NORRIS,STEVEN R, MD. (call to be seen in 2 weeks)    Contact information:   [facility] [address] [place] [address] [phone]          DISCHARGE TO:  DISPOSITION: 01-Home or Self Care  DISCHARGE CONDITION:  {Good  Pavan Bring PA-C 11/15/2011, 10:23 AM

## 2011-11-15 NOTE — Evaluation (Signed)
Occupational Therapy Evaluation Patient Details Name: Denise Nelson MRN: [id number] DOB: 1940/06/02 Today's Date: 11/15/2011 Time: 7829-5621 OT Time Calculation (min): 22 min  OT Assessment / Plan / Recommendation Clinical Impression  Pt s/p right reverse shoulder arthroplasty.  Pt doing very well with minimal pain.  Educated pt on HEP, ADLs, and sling.  Pt able to verbalize and demonstrate education.  No further acute OT needs. Will sign off at this time. Pt plans to d/c home this morning.    OT Assessment  Progress rehab of shoulder as ordered by MD at follow-up appointment    Follow Up Recommendations   (progress rehab of shoulder as recommended by MD)    Barriers to Discharge      Equipment Recommendations  None recommended by OT    Recommendations for Other Services    Frequency       Precautions / Restrictions Precautions Precautions: Shoulder Type of Shoulder Precautions: AROM within limits of pain Precaution Booklet Issued: Yes (comment) Precaution Comments: Shoulder protocol handout  Required Braces or Orthoses: Other Brace/Splint Other Brace/Splint: RUE sling for comfort/as needed   Pertinent Vitals/Pain See vitals    ADL  Eating/Feeding: Performed;Independent Where Assessed - Eating/Feeding: Chair Upper Body Dressing: Performed;Minimal assistance Where Assessed - Upper Body Dressing: Unsupported sitting Toilet Transfer: Simulated;Modified independent Toilet Transfer Method: Sit to Barista:  Nurse, children's) Equipment Used:  (RUE sling) Transfers/Ambulation Related to ADLs: mod I throughout room ADL Comments: Pt getting dressed upon OT arrival.  Educated pt on bathing/dressing technique.  Pt's roommate present during session and was independent in assisting pt.     OT Diagnosis:    OT Problem List:   OT Treatment Interventions:     OT Goals    Visit Information  Last OT Received On: 11/15/11 Assistance Needed: +1    Subjective  Data      Prior Functioning     Home Living Lives With: Friend(s) Available Help at Discharge: Family;Friend(s);Available PRN/intermittently Type of Home: House Bathroom Shower/Tub: Engineer, manufacturing systems: Standard Additional Comments: Pt will have family/friends present majority of day. Prior Function Level of Independence: Independent Dominant Hand: Right         Vision/Perception     Cognition  Overall Cognitive Status: Appears within functional limits for tasks assessed/performed Arousal/Alertness: Awake/alert Orientation Level: Appears intact for tasks assessed Behavior During Session: [facility] for tasks performed    Extremity/Trunk Assessment Right Upper Extremity Assessment RUE ROM/Strength/Tone: Unable to fully assess;Deficits;Due to precautions RUE ROM/Strength/Tone Deficits: AROM shoulder flexion/abduction to 80 degress. ER to 30 degrees.  Left Upper Extremity Assessment LUE ROM/Strength/Tone: WFL for tasks assessed     Mobility Bed Mobility Bed Mobility: Not assessed Transfers Transfers: Sit to Stand;Stand to Sit Sit to Stand: 6: Modified independent (Device/Increase time);From chair/3-in-1 Stand to Sit: 6: Modified independent (Device/Increase time);To chair/3-in-1     Shoulder Instructions Donning/doffing shirt without moving shoulder: Minimal assistance;Caregiver independent with task Donning/doffing sling/immobilizer: Supervision/safety;Caregiver independent with task (verbal cue for correct positioning) Correct positioning of sling/immobilizer: Supervision/safety;Caregiver independent with task Pendulum exercises (written home exercise program): Independent ROM for elbow, wrist and digits of operated UE: Independent Sling wearing schedule (on at all times/off for ADL's): Independent   Exercise Shoulder Exercises Pendulum Exercise: AROM;Right;10 reps;Standing Shoulder Flexion: AROM;Right;5 reps;Seated Shoulder Extension: AROM;Right;5  reps;Seated Shoulder ABduction: AROM;Right;Seated;5 reps Shoulder External Rotation: AROM;Right;5 reps;Seated Elbow Flexion: AROM;Right;Seated;5 reps Elbow Extension: AROM;Right;5 reps;Seated Wrist Flexion: AROM;Right;5 reps;Seated Wrist Extension: AROM;Right;5 reps;Seated Digit Composite Flexion: AROM;Right;5 reps;Seated Composite Extension:  AROM;Right;5 reps;Seated   Balance Balance Balance Assessed: Yes Static Standing Balance Static Standing - Balance Support: No upper extremity supported Static Standing - Level of Assistance: 6: Modified independent (Device/Increase time)   End of Session OT - End of Session Equipment Utilized During Treatment:  (RUE sling) Activity Tolerance: Patient tolerated treatment well Patient left: in chair;with call bell/phone within reach;with family/visitor present Nurse Communication: Mobility status  GO    11/15/2011 Cipriano Mile OTR/L Pager [phone] Office [phone]  Cipriano Mile 11/15/2011, 1:26 PM

## 2011-11-15 NOTE — Op Note (Signed)
NAMELACHRISHA, Denise Nelson                 ACCOUNT NO.:  [id number]  MEDICAL RECORD NO.:  [id number]  LOCATION:  [id number]                        FACILITY:  [facility]  PHYSICIAN:  Almedia Balls. Ranell Patrick, M.D. DATE OF BIRTH:  1941/01/15  DATE OF PROCEDURE:  11/14/2011 DATE OF DISCHARGE:                              OPERATIVE REPORT   PREOPERATIVE DIAGNOSIS:  Right shoulder rotator cuff tear arthropathy.  POSTOPERATIVE DIAGNOSIS:  Right shoulder rotator cuff tear arthropathy.  PROCEDURE PERFORMED:  Right reverse total shoulder arthroplasty.  ATTENDING SURGEON:  Almedia Balls. Ranell Patrick, MD  ASSISTANT:  Donnie Coffin. Dixon, PA-C who was scrubbed during the entire procedure and necessary for satisfactory completion of surgery.  ANESTHESIA:  General anesthesia was used plus interscalene block.  ESTIMATED BLOOD LOSS:  Minimal.  FLUID REPLACEMENT:  1200 mL of crystalloid.  INSTRUMENT COUNTS:  Correct.  COMPLICATIONS:  There were no complications.  ANTIBIOTICS:  Perioperative antibiotics given.  INDICATIONS:  The patient is a 71 year old female with worsening right shoulder pain and loss of function secondary to rotator cuff tear arthropathy and rheumatoid arthritis.  The patient has failed all measures of conservative management including injections, modification of activity, anti-inflammatories, and presents with refractory shoulder pain desiring shoulder replacement surgery.  Informed consent was obtained.  DESCRIPTION OF PROCEDURE:  After adequate level of anesthesia achieved, the patient was positioned in modified beach-chair position.  Right shoulder correctly identified, sterilely prepped and draped in the usual manner.  Time-out was called.  We initiated surgery through a deltopectoral incision starting at the coracoid process extending down to the anterior humeral shaft.  Dissection down through subcutaneous tissues.  Cephalic vein identified, taken laterally to the deltoid pectoralis, taken  medially.  The conjoined tendon was identified and retracted medially as well.  The subscapularis was released off the lesser tuberosity and tagged for later repair.  The rotator cuff was torn.  We progressively externally rotated and released the capsule off the humeral neck and then delivered the shoulder humerus out of the wound.  We then entered the proximal humerus using a 6-mm reamer and then reamed up to size 12.  We then introduced the metaphyseal resection using the 12 intramedullary guide and reamed the metaphysis proximally set on 10 degrees of retroversion.  Next, we went and introduced the trial size 12 epi 1 right into place impacting into position, again set on 10 degrees of retroversion.  We then retracted the humerus posteriorly.  We did a 360-degree capsular resection.  We identified the glenoid, removed the cartilage off the glenoid with a Cobb elevator.  We carefully protected the axillary nerve during the procedure.  We had retractors placed with good visualization.  We then placed our central guide pin, reamed for the metaglene, drilled our central peg hole for the metaglene, and impacted the real metaglene in position for the DePuy Delta Xtend prosthesis.  Once that was in place, we placed our lock screws superiorly and inferiorly, 36 mm screws for both those and then nonlocked screws 18 anteriorly and posteriorly.  We had good position low on the glenoid.  We then placed a 38 standard glenoid sphere in place screwing into position.  Once we  had that in place, we thoroughly irrigated.  We then trialed with +38 +6 polyethylene trial insert.  We reduced the shoulder.  We were happy with soft tissue balancing.  We had good tension on the conjoint tendon.  We had no gapping with inferior pole and no gapping with external rotation.  We removed the trial components, thoroughly irrigated the shoulder.  We then went ahead and introduced the real 12 body with the epi 1  hydroxyapatite coated press- fit stem into place for DePuy Delta Xtend prosthesis using bone graft medially in an impaction grafting type technique.  Once that was impacted in place 10 degrees retroversion, which trialed again with a +38 +6.  We were happy with that and placed a real 38 +6, pulse irrigated the shoulder, reduced the shoulder.  We were happy with the soft tissue balancing.  No instability at all.  We then repaired the subscapularis anatomically using #2 FiberWire suture through drill holes and the humerus.  We had anatomic repair of that actually on the greater tuberosity, so across the biceps groove gaining excellent stability and hopefully, gained some added function of that did not restrict range of motion at all.  We ranged her shoulder fully, and nice stable shoulder. We thoroughly irrigated and closed deltopectoral interval with 0 Vicryl suture followed by 2-0 Vicryl subcutaneous closure and 4-0 Monocryl for skin.  Steri-Strips were applied followed by sterile bandage and a shoulder sling.  The patient tolerated the procedure well.     Almedia Balls. Ranell Patrick, M.D.     SRN/MEDQ  D:  11/14/2011  T:  11/15/2011  Job:  [id number]

## 2011-11-18 ENCOUNTER — Encounter (HOSPITAL_COMMUNITY): Payer: Self-pay | Admitting: Orthopedic Surgery

## 2011-11-20 ENCOUNTER — Encounter (HOSPITAL_COMMUNITY): Payer: Self-pay

## 2012-03-19 ENCOUNTER — Other Ambulatory Visit: Payer: Self-pay | Admitting: Neurosurgery

## 2012-03-19 DIAGNOSIS — M5137 Other intervertebral disc degeneration, lumbosacral region: Secondary | ICD-10-CM

## 2012-03-19 DIAGNOSIS — M545 Low back pain: Secondary | ICD-10-CM

## 2012-03-24 ENCOUNTER — Other Ambulatory Visit: Payer: Self-pay | Admitting: Neurosurgery

## 2012-03-26 ENCOUNTER — Ambulatory Visit
Admission: RE | Admit: 2012-03-26 | Discharge: 2012-03-26 | Disposition: A | Discharge status: Home / Self Care | Payer: Medicare Other | Source: Ambulatory Visit | Attending: Neurosurgery | Admitting: Neurosurgery

## 2012-03-26 DIAGNOSIS — M4120 Other idiopathic scoliosis, site unspecified: Secondary | ICD-10-CM

## 2012-03-26 DIAGNOSIS — M5137 Other intervertebral disc degeneration, lumbosacral region: Secondary | ICD-10-CM

## 2013-09-05 ENCOUNTER — Encounter (HOSPITAL_COMMUNITY): Payer: Self-pay | Admitting: Emergency Medicine

## 2013-09-05 ENCOUNTER — Emergency Department (HOSPITAL_COMMUNITY): Payer: Medicare Other

## 2013-09-05 ENCOUNTER — Emergency Department (HOSPITAL_COMMUNITY)
Admission: EM | Admit: 2013-09-05 | Discharge: 2013-09-05 | Disposition: A | Discharge status: Home / Self Care | Payer: Medicare Other | Attending: Emergency Medicine | Admitting: Emergency Medicine

## 2013-09-05 DIAGNOSIS — S92309A Fracture of unspecified metatarsal bone(s), unspecified foot, initial encounter for closed fracture: Secondary | ICD-10-CM | POA: Insufficient documentation

## 2013-09-05 DIAGNOSIS — F17200 Nicotine dependence, unspecified, uncomplicated: Secondary | ICD-10-CM | POA: Insufficient documentation

## 2013-09-05 DIAGNOSIS — I10 Essential (primary) hypertension: Secondary | ICD-10-CM | POA: Diagnosis not present

## 2013-09-05 DIAGNOSIS — Z79899 Other long term (current) drug therapy: Secondary | ICD-10-CM | POA: Insufficient documentation

## 2013-09-05 DIAGNOSIS — IMO0002 Reserved for concepts with insufficient information to code with codable children: Secondary | ICD-10-CM | POA: Insufficient documentation

## 2013-09-05 DIAGNOSIS — S99929A Unspecified injury of unspecified foot, initial encounter: Secondary | ICD-10-CM

## 2013-09-05 DIAGNOSIS — Y9389 Activity, other specified: Secondary | ICD-10-CM | POA: Insufficient documentation

## 2013-09-05 DIAGNOSIS — S8990XA Unspecified injury of unspecified lower leg, initial encounter: Secondary | ICD-10-CM | POA: Diagnosis present

## 2013-09-05 DIAGNOSIS — Z8659 Personal history of other mental and behavioral disorders: Secondary | ICD-10-CM | POA: Diagnosis not present

## 2013-09-05 DIAGNOSIS — Y9289 Other specified places as the place of occurrence of the external cause: Secondary | ICD-10-CM | POA: Insufficient documentation

## 2013-09-05 DIAGNOSIS — S99919A Unspecified injury of unspecified ankle, initial encounter: Secondary | ICD-10-CM

## 2013-09-05 DIAGNOSIS — Z8739 Personal history of other diseases of the musculoskeletal system and connective tissue: Secondary | ICD-10-CM | POA: Insufficient documentation

## 2013-09-05 DIAGNOSIS — S92301A Fracture of unspecified metatarsal bone(s), right foot, initial encounter for closed fracture: Secondary | ICD-10-CM

## 2013-09-05 MED ORDER — ACETAMINOPHEN 325 MG PO TABS: 650.0000 mg | ORAL_TABLET | Freq: Four times a day (QID) | ORAL | Status: AC | PRN

## 2013-09-05 NOTE — Discharge Instructions (Signed)
Call Dr. Maylene RoesNorse or another orthopedic specialist for further evaluations of your broken/fractured foot bone. Wear the Cam Walker to walk at all times. Call for a follow up appointment with a Family or Primary Care Provider.  Return if Symptoms worsen.   Take medication as prescribed.  Elevate your foot above your heart when you're not walking or standing. Ice 3-4 times a day. Do not overdose on tylenol your ROXICET contains tylenol.

## 2013-09-05 NOTE — ED Notes (Signed)
PT. CURRENTLY IN X-RAY.  

## 2013-09-05 NOTE — ED Notes (Signed)
Stubbed rt great toe a week ago and now foot is swollen and tender thought irt was just sp[rained hurts to walk

## 2013-09-05 NOTE — ED Provider Notes (Signed)
CSN: [id number]     Arrival date & time 09/05/13  1326 History  This chart was scribed for non-physician practitioner, Mellody DrownLauren Chong January, PA-C working with Gilda Creasehristopher J. Pollina, MD by Greggory StallionKayla Andersen, ED scribe. This patient was seen in room TR05C/TR05C and the patient's care was started at 2:55 PM.   Chief Complaint  Patient presents with  . Foot Pain   HPI Comments: Denise Nelson is a 73 y.o. female who presents to the Emergency Department complaining of right foot injury that occurred one week ago. States she stubbed her toe and has sudden onset pain with associated swelling. Denies other injury. Bearing weight worsens the pain so she has been using a friend's walker. At baseline patient ambulates without assistance of any device. Pt has elevated and iced with no relief. She has also used oxycodone with some relief.   The history is provided by the patient. No language interpreter was used.    Past Medical History  Diagnosis Date  . Hypertension   . Anxiety   . Arthritis     systemic sclerosis- per pt. -rheumatoid - bothered  in both ankles    Past Surgical History  Procedure Laterality Date  . Abdominal hysterectomy    . Bunionectomy      40 yrs. ago- both feet   . Reverse shoulder arthroplasty  11/14/2011    Procedure: REVERSE SHOULDER ARTHROPLASTY;  Surgeon: Verlee RossettiSteven R Norris, MD;  [facility];  Service: Orthopedics;  Laterality: Right;  GENERAL WITH BLOCK   No family history on file. History  Substance Use Topics  . Smoking status: Current Some Day Smoker    Types: Cigarettes  . Smokeless tobacco: Not on file  . Alcohol Use: No   OB History   Grav Para Term Preterm Abortions TAB SAB Ect Mult Living                 Review of Systems  Musculoskeletal: Positive for arthralgias, gait problem and joint swelling.  Skin: Negative for wound.  Neurological: Negative for weakness and numbness.  All other systems reviewed and are negative.  Allergies  Review of patient's  allergies indicates no known allergies.  Home Medications   Prior to Admission medications   Medication Sig Start Date End Date Taking? Authorizing Provider  benazepril-hydrochlorthiazide (LOTENSIN HCT) 20-25 MG per tablet Take 1 tablet by mouth daily.   Yes Historical Provider, MD  Menthol, Topical Analgesic, (BENGAY EX) Apply 1 application topically 2 (two) times daily as needed (as needed for muscle pain).   Yes Historical Provider, MD  oxyCODONE-acetaminophen (ROXICET) 5-325 MG per tablet Take 1-2 tablets by mouth every 4 (four) hours as needed for pain. 11/14/11  Yes Verlee RossettiSteven R Norris, MD  predniSONE (DELTASONE) 5 MG tablet Take 7.5 mg by mouth daily.    Yes Historical Provider, MD  traMADol (ULTRAM) 50 MG tablet Take 50 mg by mouth every 6 (six) hours as needed (pain). For pain   Yes Historical Provider, MD   BP 143/63  Pulse 107  Temp(Src) 98.2 F (36.8 C) (Oral)  Resp 18  SpO2 96%  Physical Exam  Nursing note and vitals reviewed. Constitutional: She is oriented to person, place, and time. She appears well-developed and well-nourished.  Non-toxic appearance. She does not have a sickly appearance. She does not appear ill. No distress.  HENT:  Head: Normocephalic and atraumatic.  Eyes: Conjunctivae and EOM are normal.  Neck: Neck supple.  Pulmonary/Chest: Effort normal. No respiratory distress.  Musculoskeletal: Normal  range of motion.       Right foot: She exhibits tenderness.       Feet:  Right foot with moderate swelling. Good Active ROM toes, with discomfort. Good capillary refill. Sensation intact.  Neurological: She is alert and oriented to person, place, and time.  Skin: Skin is warm and dry. She is not diaphoretic.  Psychiatric: She has a normal mood and affect. Her behavior is normal.    ED Course  Procedures (including critical care time)  COORDINATION OF CARE: 3:01 PM-Discussed treatment plan which includes orthopedic follow up with pt at bedside and pt agreed  to plan.   Labs Review Labs Reviewed - No data to display  Imaging Review Dg Foot Complete Right  09/05/2013   CLINICAL DATA:  Pain and swelling secondary to injury 1 week ago.  EXAM: RIGHT FOOT COMPLETE - 3+ VIEW  COMPARISON:  None.  FINDINGS: There is a slightly displaced fracture of the distal shaft of the second metatarsal. There is a slight hallux valgus deformity at the first metatarsophalangeal joint with evidence of previous bunionectomy. No other significant abnormalities.  IMPRESSION: Fracture of the second metatarsal shaft   Electronically Signed   By: Geanie Cooley M.D.   On: 09/05/2013 14:02     EKG Interpretation None      MDM   Final diagnoses:  Metatarsal bone fracture, right, closed, initial encounter   Patient presents after injury to right foot approximately one week ago x-ray shows slightly displaced fracture second metatarsal, plan to discharge with Cam Walker, Ortho follow up, patient is on Roxicet at baseline, will add tylenol given age. Discussed imaging results, and treatment plan with the patient. Return precautions given. Reports understanding and no other concerns at this time.  Patient is stable for discharge at this time. Meds given in ED:  Medications - No data to display  Discharge Medication List as of 09/05/2013  3:29 PM    START taking these medications   Details  acetaminophen (TYLENOL) 325 MG tablet Take 2 tablets (650 mg total) by mouth every 6 (six) hours as needed., Starting 09/05/2013, Until Discontinued, Print        I personally performed the services described in this documentation, which was scribed in my presence. The recorded information has been reviewed and is accurate.  Mellody Drown, PA-C 09/07/13 [phone]

## 2013-09-08 NOTE — ED Provider Notes (Signed)
Medical screening examination/treatment/procedure(s) were performed by non-physician practitioner and as supervising physician I was immediately available for consultation/collaboration.  Christopher J. Pollina, MD 09/08/13 1746 

## 2014-05-28 DEATH — deceased

## 2015-11-26 IMAGING — CR DG FOOT COMPLETE 3+V*R*
3 series · 3 of 3 positions shown · non-contrast
Comparison: None.

CLINICAL DATA: Pain and swelling secondary to injury 1 week ago.

EXAM:
RIGHT FOOT COMPLETE - 3+ VIEW

[t foot ap right *]
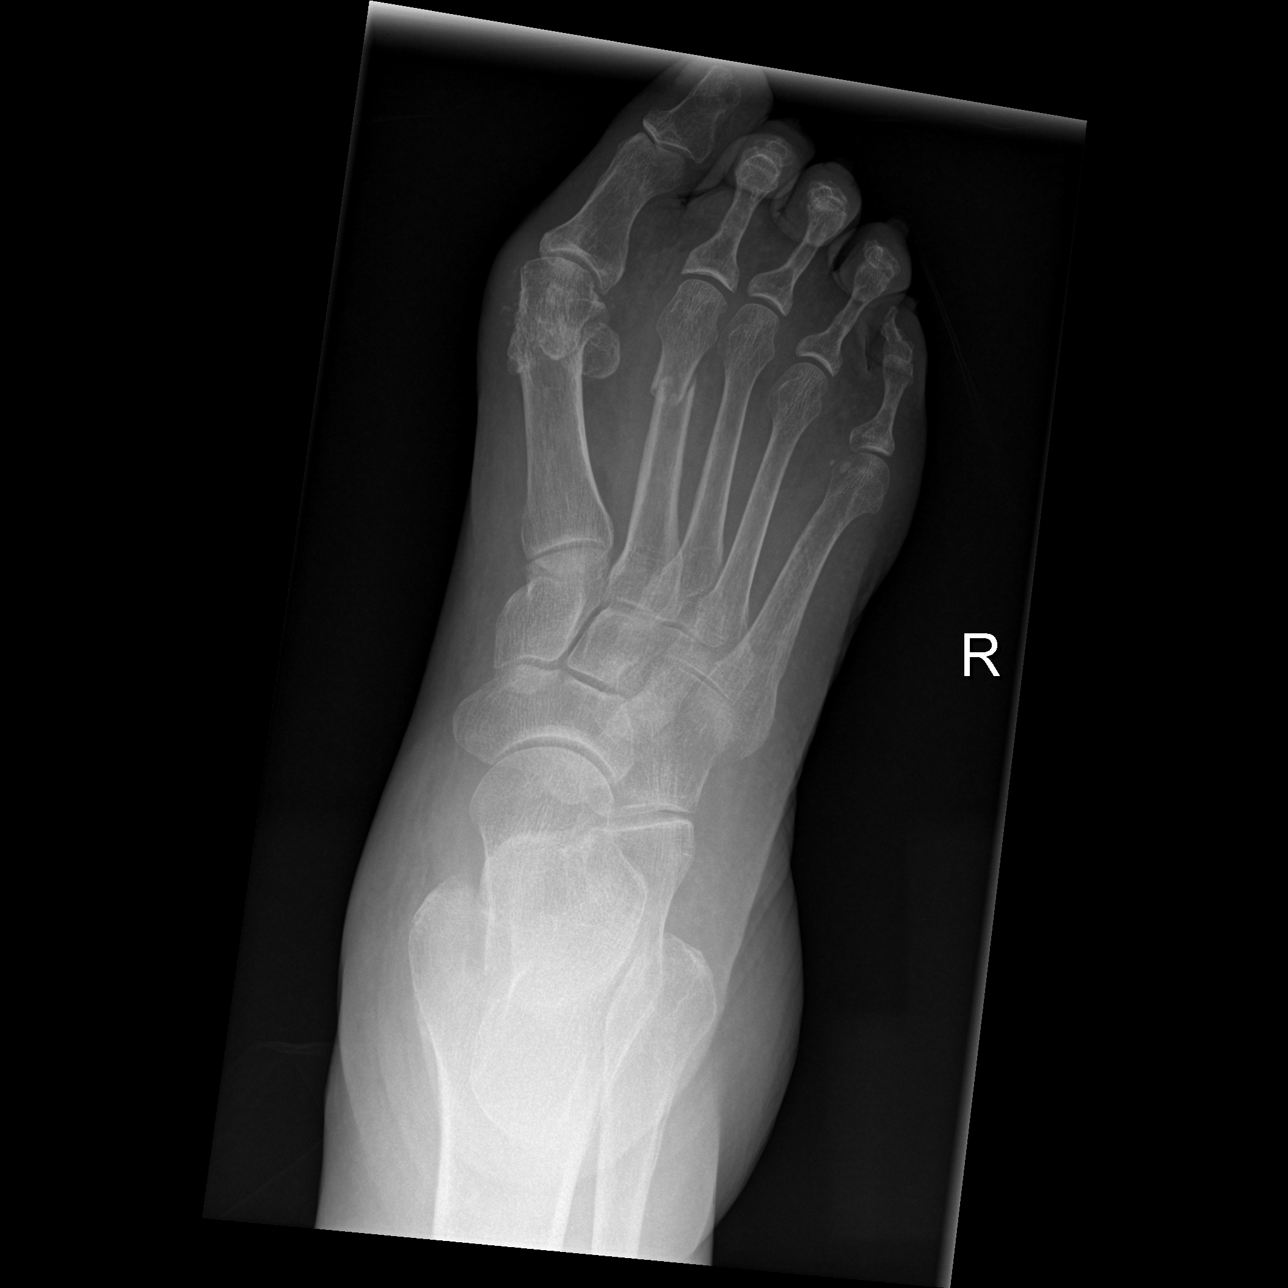

[t foot oblique right *]
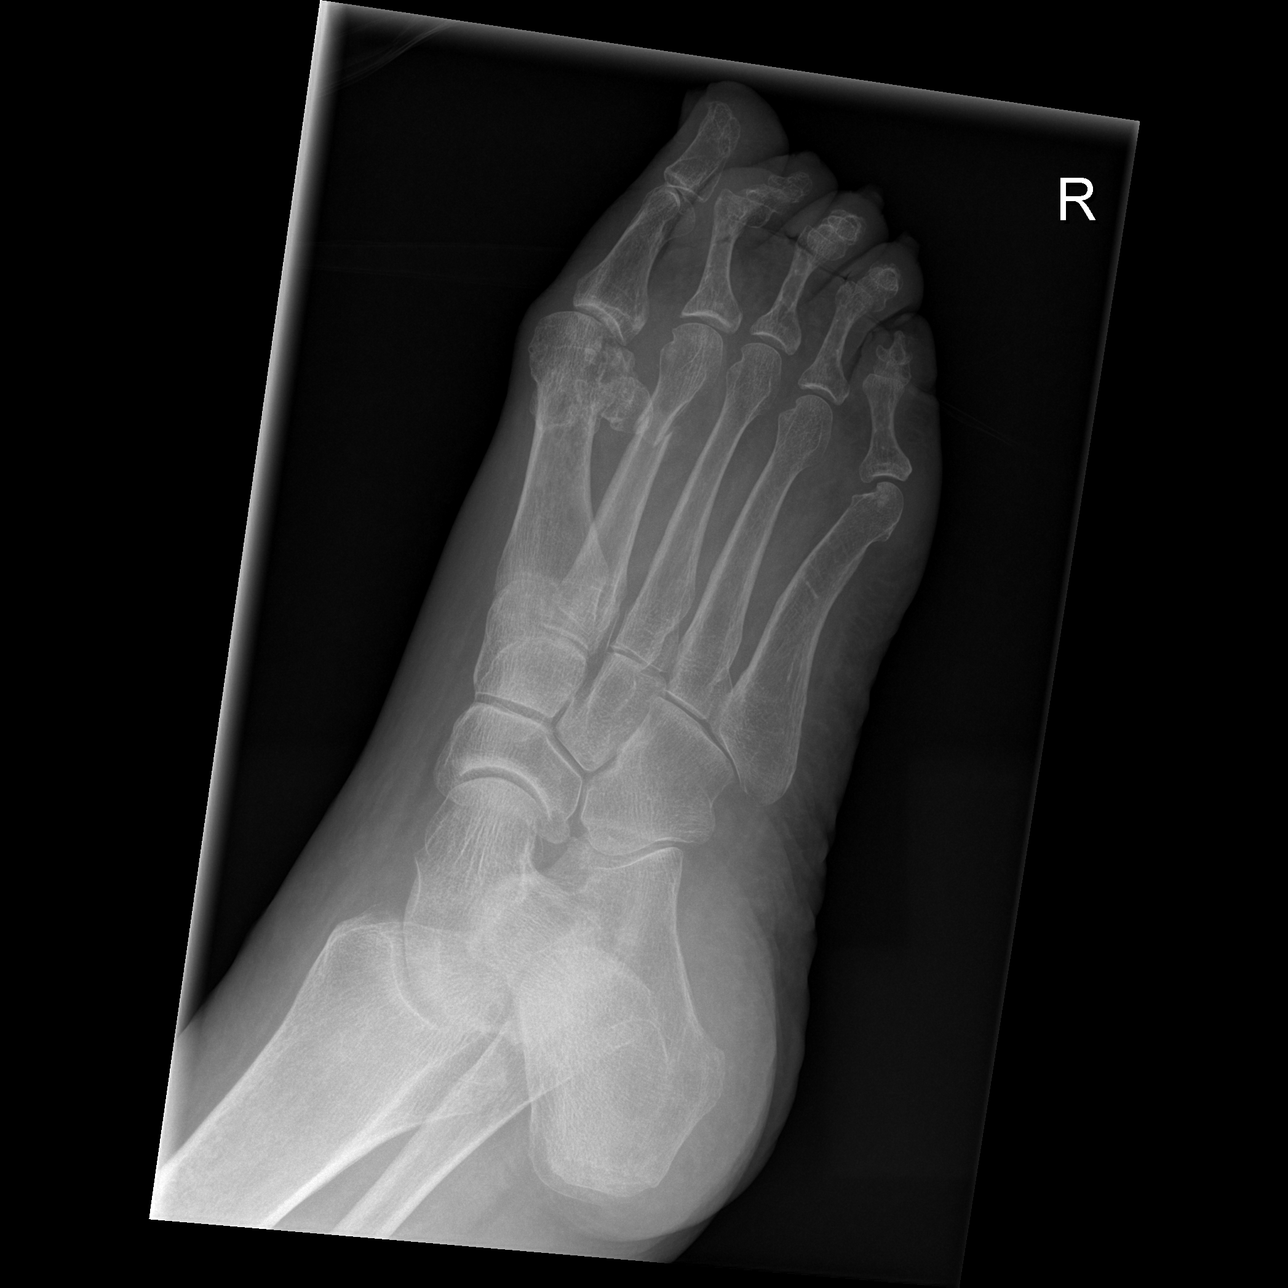

[t foot lat right]
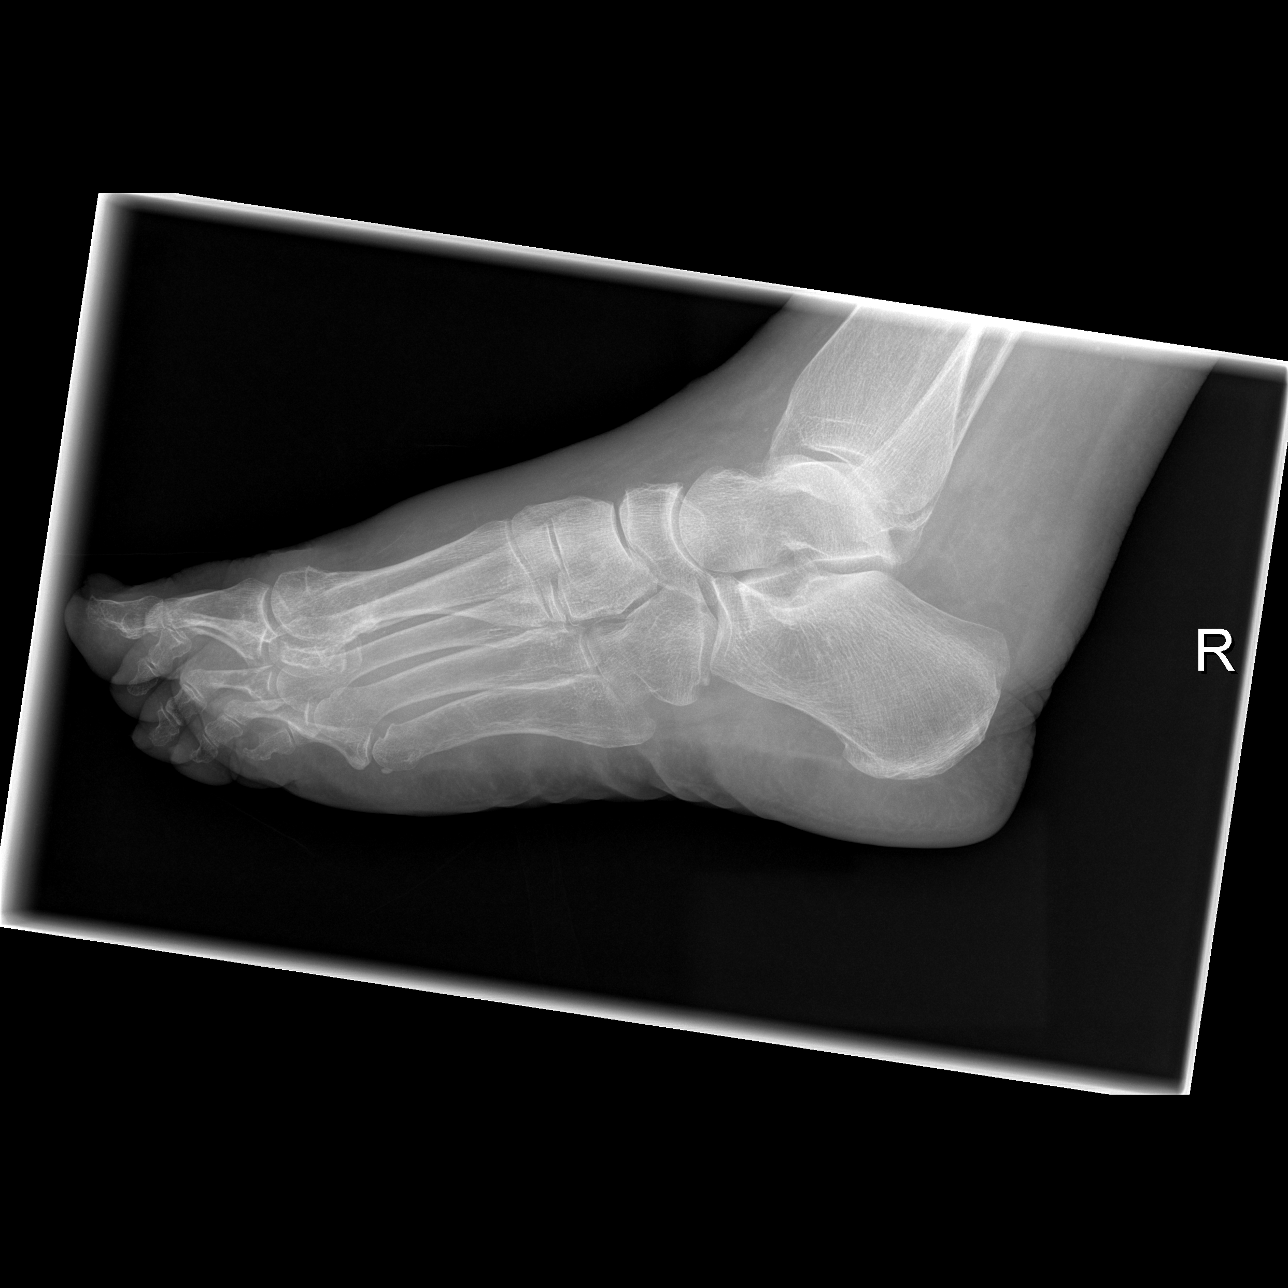

[3 of 3 positions shown; findings below may reference images not displayed]

FINDINGS: There is a slightly displaced fracture of the distal shaft of the
second metatarsal. There is a slight hallux valgus deformity at the
first metatarsophalangeal joint with evidence of previous
bunionectomy. No other significant abnormalities.
IMPRESSION: Fracture of the second metatarsal shaft
